# Patient Record
Sex: Male | Born: 1975 | Hispanic: Yes | Marital: Single | Smoking: Never smoker
Health system: Southern US, Community
[De-identification: ages and names within clinical notes are randomized; demographics above are authoritative.]

---

## 2017-05-25 ENCOUNTER — Emergency Department (HOSPITAL_COMMUNITY)
Admission: EM | Admit: 2017-05-25 | Discharge: 2017-05-25 | Disposition: A | Discharge status: Home / Self Care | Payer: Self-pay | Attending: Emergency Medicine | Admitting: Emergency Medicine

## 2017-05-25 ENCOUNTER — Emergency Department (HOSPITAL_COMMUNITY): Payer: Self-pay

## 2017-05-25 ENCOUNTER — Encounter (HOSPITAL_COMMUNITY): Payer: Self-pay | Admitting: Emergency Medicine

## 2017-05-25 DIAGNOSIS — J40 Bronchitis, not specified as acute or chronic: Secondary | ICD-10-CM | POA: Insufficient documentation

## 2017-05-25 DIAGNOSIS — R0789 Other chest pain: Secondary | ICD-10-CM | POA: Insufficient documentation

## 2017-05-25 DIAGNOSIS — R51 Headache: Secondary | ICD-10-CM | POA: Insufficient documentation

## 2017-05-25 LAB — CBC
HCT: 43.4 % (ref 39.0–52.0)
HEMOGLOBIN: 14.8 g/dL (ref 13.0–17.0)
MCH: 27.9 pg (ref 26.0–34.0)
MCHC: 34.1 g/dL (ref 30.0–36.0)
MCV: 81.7 fL (ref 78.0–100.0)
Platelets: 365 10*3/uL (ref 150–400)
RBC: 5.31 MIL/uL (ref 4.22–5.81)
RDW: 12 % (ref 11.5–15.5)
WBC: 8.6 10*3/uL (ref 4.0–10.5)

## 2017-05-25 LAB — BASIC METABOLIC PANEL
ANION GAP: 9 (ref 5–15)
BUN: 18 mg/dL (ref 6–20)
CHLORIDE: 103 mmol/L (ref 101–111)
CO2: 26 mmol/L (ref 22–32)
CREATININE: 1.06 mg/dL (ref 0.61–1.24)
Calcium: 9.8 mg/dL (ref 8.9–10.3)
GFR calc Af Amer: >60 mL/min (ref >60)
GFR calc non Af Amer: >60 mL/min (ref >60)
GLUCOSE: 122 mg/dL — AB (ref 65–99)
Potassium: 3.9 mmol/L (ref 3.5–5.1)
Sodium: 138 mmol/L (ref 135–145)

## 2017-05-25 LAB — I-STAT TROPONIN, ED: Troponin i, poc: 0 ng/mL (ref 0.00–0.08)

## 2017-05-25 MED ORDER — DOXYCYCLINE HYCLATE 100 MG PO CAPS: 100.0000 mg | ORAL_CAPSULE | Freq: Two times a day (BID) | ORAL | 0 refills | Status: AC

## 2017-05-25 MED ORDER — SODIUM CHLORIDE 0.9 % IV BOLUS (SEPSIS)
1000.0000 mL | Freq: Once | INTRAVENOUS | Status: AC
Start: 2017-05-25 — End: 2017-05-25
  Administered 2017-05-25: 1000 mL via INTRAVENOUS

## 2017-05-25 MED ORDER — IBUPROFEN 800 MG PO TABS: 800.0000 mg | ORAL_TABLET | Freq: Three times a day (TID) | ORAL | 0 refills | Status: AC | PRN

## 2017-05-25 MED ORDER — KETOROLAC TROMETHAMINE 30 MG/ML IJ SOLN
30.0000 mg | Freq: Once | INTRAMUSCULAR | Status: AC
  Administered 2017-05-25: 30 mg via INTRAVENOUS
  Filled 2017-05-25: qty 1

## 2017-05-25 NOTE — Discharge Instructions (Signed)
Follow-up with with your family doctor in 1 week if not improving

## 2017-05-25 NOTE — ED Provider Notes (Signed)
[facility]-EMERGENCY DEPT Provider Note   CSN: [id number] Arrival date & time: 05/25/17  1109     History   Chief Complaint Chief Complaint  Patient presents with  . Headache  . Chest Pain    HPI Philip Mathews is a 42 y.o. male.  Patient complains of cough congestion for over a week with myalgias   The history is provided by the patient.  Cough  This is a new problem. The current episode started more than 1 week ago. The problem occurs constantly. The problem has not changed since onset.The cough is non-productive. There has been no fever. Associated symptoms include chest pain. Pertinent negatives include no headaches.    History reviewed. No pertinent past medical history.  There are no active problems to display for this patient.   History reviewed. No pertinent surgical history.     Home Medications    Prior to Admission medications   Medication Sig Start Date End Date Taking? Authorizing Provider  Pseudoeph-Doxylamine-DM-APAP (NYQUIL PO) Take 30 mLs by mouth at bedtime as needed (cold symptoms).   Yes [provider]  doxycycline (VIBRAMYCIN) 100 MG capsule Take 1 capsule (100 mg total) by mouth 2 (two) times daily. One po bid x 7 days 05/25/17   Bethann BerkshireZammit, Brieanne Mignone, MD  ibuprofen (ADVIL,MOTRIN) 800 MG tablet Take 1 tablet (800 mg total) by mouth every 8 (eight) hours as needed. 05/25/17   Bethann BerkshireZammit, Dorlene Footman, MD    Family History History reviewed. No pertinent family history.  Social History Social History   Tobacco Use  . Smoking status: Never Smoker  . Smokeless tobacco: Never Used  Substance Use Topics  . Alcohol use: Not on file  . Drug use: Not on file     Allergies   Patient has no known allergies.   Review of Systems Review of Systems  Constitutional: Negative for appetite change and fatigue.  HENT: Negative for congestion, ear discharge and sinus pressure.   Eyes: Negative for discharge.  Respiratory: Positive for  cough.   Cardiovascular: Positive for chest pain.  Gastrointestinal: Negative for abdominal pain and diarrhea.  Genitourinary: Negative for frequency and hematuria.  Musculoskeletal: Negative for back pain.  Skin: Negative for rash.  Neurological: Negative for seizures and headaches.  Psychiatric/Behavioral: Negative for hallucinations.     Physical Exam Updated Vital Signs BP 115/74   Pulse 72   Temp 98.4 F (36.9 C) (Oral)   Resp (!) 22   SpO2 99%   Physical Exam  Constitutional: He is oriented to person, place, and time. He appears well-developed.  HENT:  Head: Normocephalic.  Eyes: Conjunctivae and EOM are normal. No scleral icterus.  Neck: Neck supple. No thyromegaly present.  Cardiovascular: Normal rate and regular rhythm. Exam reveals no gallop and no friction rub.  No murmur heard. Pulmonary/Chest: No stridor. He has no wheezes. He has no rales. He exhibits no tenderness.  Abdominal: He exhibits no distension. There is no tenderness. There is no rebound.  Musculoskeletal: Normal range of motion. He exhibits no edema.  Lymphadenopathy:    He has no cervical adenopathy.  Neurological: He is oriented to person, place, and time. He exhibits normal muscle tone. Coordination normal.  Skin: No rash noted. No erythema.  Psychiatric: He has a normal mood and affect. His behavior is normal.     ED Treatments / Results  Labs (all labs ordered are listed, but only abnormal results are displayed) Labs Reviewed  BASIC METABOLIC PANEL - Abnormal; Notable for  the following components:      Result Value   Glucose, Bld 122 (*)    All other components within normal limits  CBC  I-STAT TROPONIN, ED    EKG  EKG Interpretation  Date/Time:  Wednesday May 25 2017 11:31:33 EST Ventricular Rate:  115 PR Interval:    QRS Duration: 95 QT Interval:  308 QTC Calculation: 426 R Axis:   107 Text Interpretation:  Sinus tachycardia Right axis deviation Borderline T  abnormalities, inferior leads Confirmed by Bethann Berkshire [phone]) on 05/25/2017 6:05:14 PM       Radiology Dg Chest 2 View  Result Date: 05/25/2017 CLINICAL DATA:  Central chest pain, fever, shortness of breath and cough. EXAM: CHEST  2 VIEW COMPARISON:  None. FINDINGS: Trachea is midline. Heart size normal. Lungs are clear. No pleural fluid. IMPRESSION: Negative. Electronically Signed   By: Leanna Battles M.D.   On: 05/25/2017 12:19    Procedures Procedures (including critical care time)  Medications Ordered in ED Medications  sodium chloride 0.9 % bolus 1,000 mL (1,000 mLs Intravenous New Bag/Given 05/25/17 2004)  ketorolac (TORADOL) 30 MG/ML injection 30 mg (30 mg Intravenous Given 05/25/17 2005)     Initial Impression / Assessment and Plan / ED Course  I have reviewed the triage vital signs and the nursing notes.  Pertinent labs & imaging results that were available during my care of the patient were reviewed by me and considered in my medical decision making (see chart for details).    Patient with persistent bronchitis and myalgias.  Patient will be placed on doxycycline and given Motrin and will follow-up in a week  Final Clinical Impressions(s) / ED Diagnoses   Final diagnoses:  Bronchitis    ED Discharge Orders        Ordered    doxycycline (VIBRAMYCIN) 100 MG capsule  2 times daily     05/25/17 2126    ibuprofen (ADVIL,MOTRIN) 800 MG tablet  Every 8 hours PRN     05/25/17 2126       Bethann Berkshire, MD 05/25/17 2132

## 2017-05-25 NOTE — ED Notes (Signed)
Interpreter Vernona RiegerLaura [phone] for assessment and treatment.

## 2017-05-25 NOTE — ED Triage Notes (Addendum)
Pt reports HA, fever, SOB and CP for the past few days. Has had generalized malaise for the past 2 weeks. No dizziness.

## 2019-02-26 IMAGING — CR DG CHEST 2V
2 series · 2 of 2 positions shown · non-contrast
Comparison: None.

CLINICAL DATA: Central chest pain, fever, shortness of breath and
cough.

EXAM:
CHEST  2 VIEW

[w chest pa]
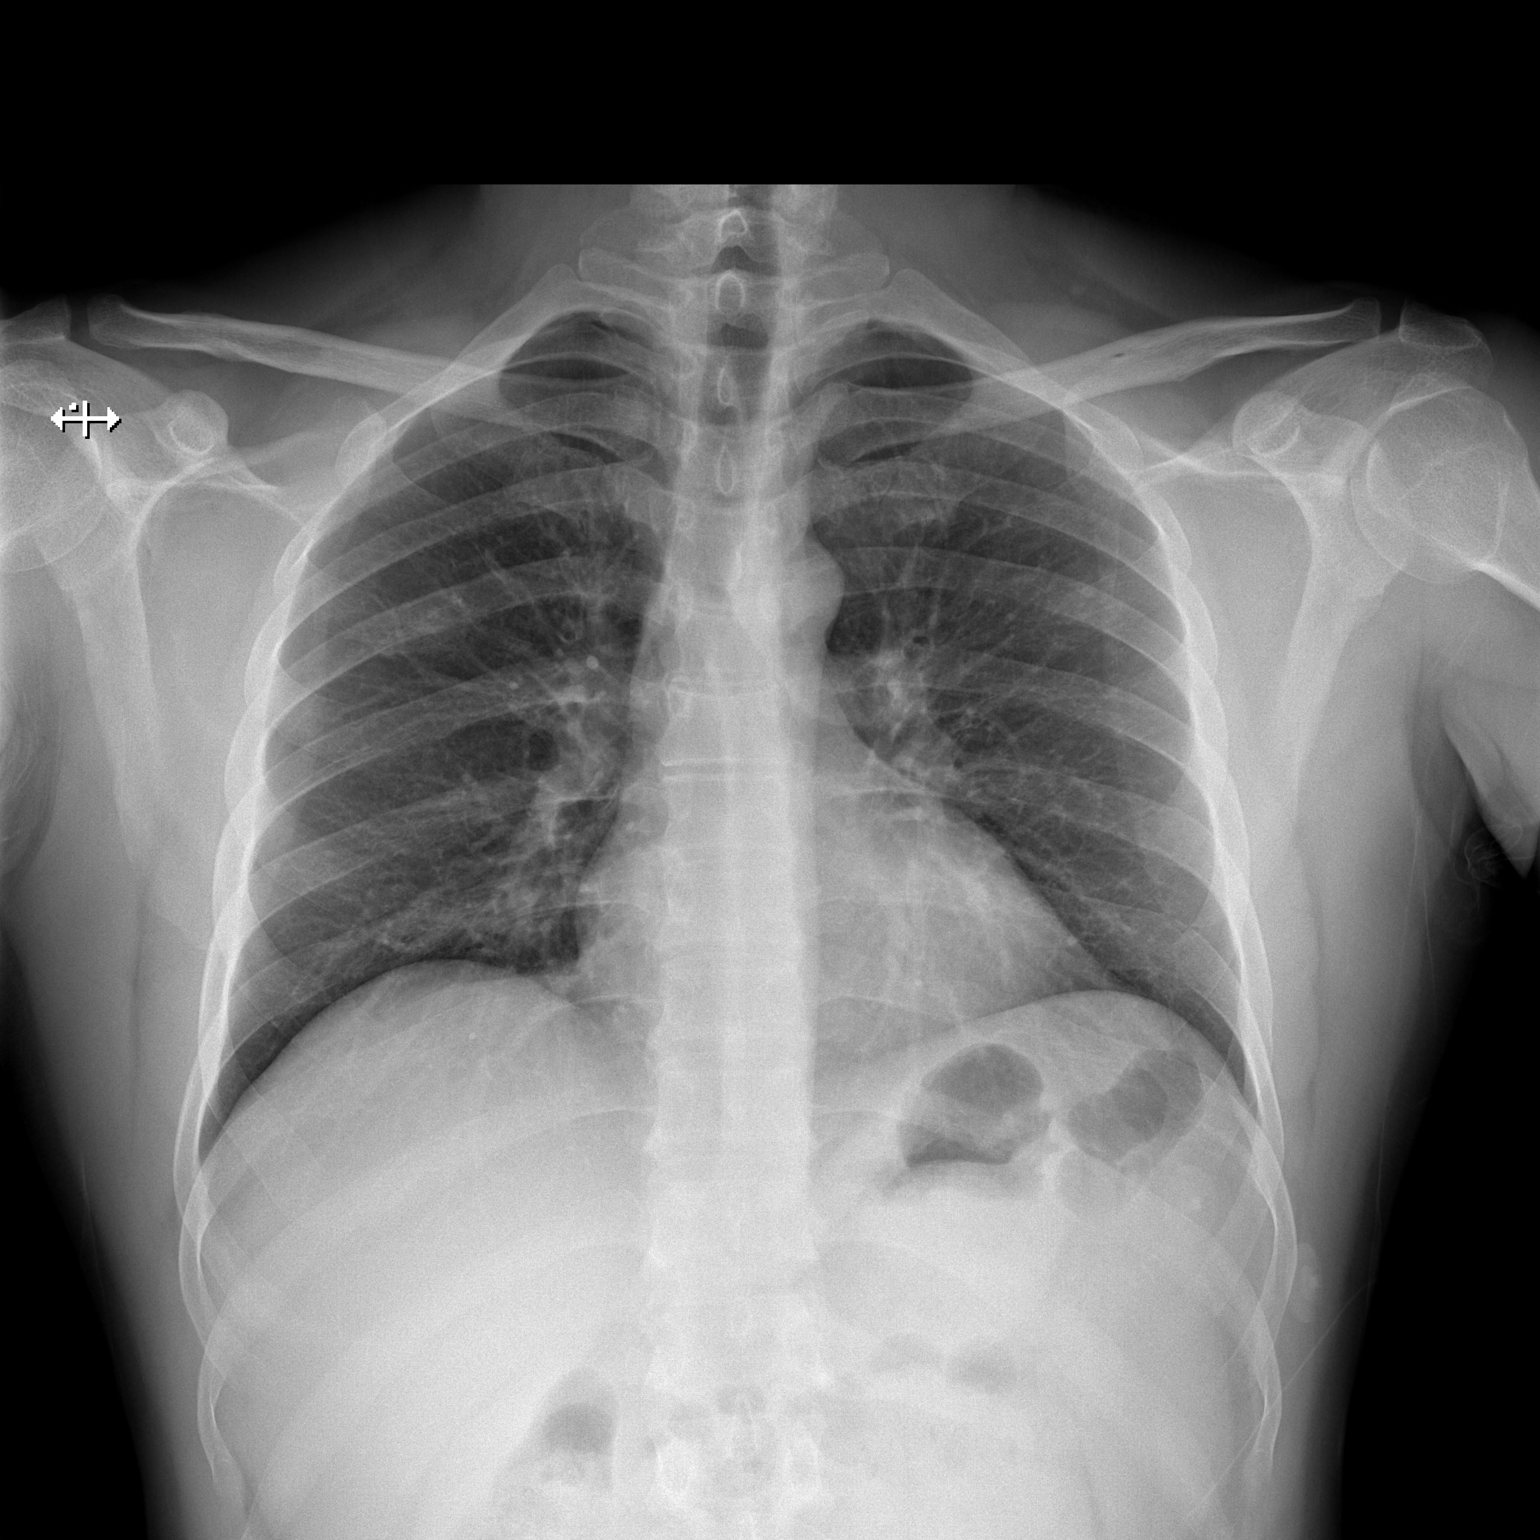

[w chest lat]
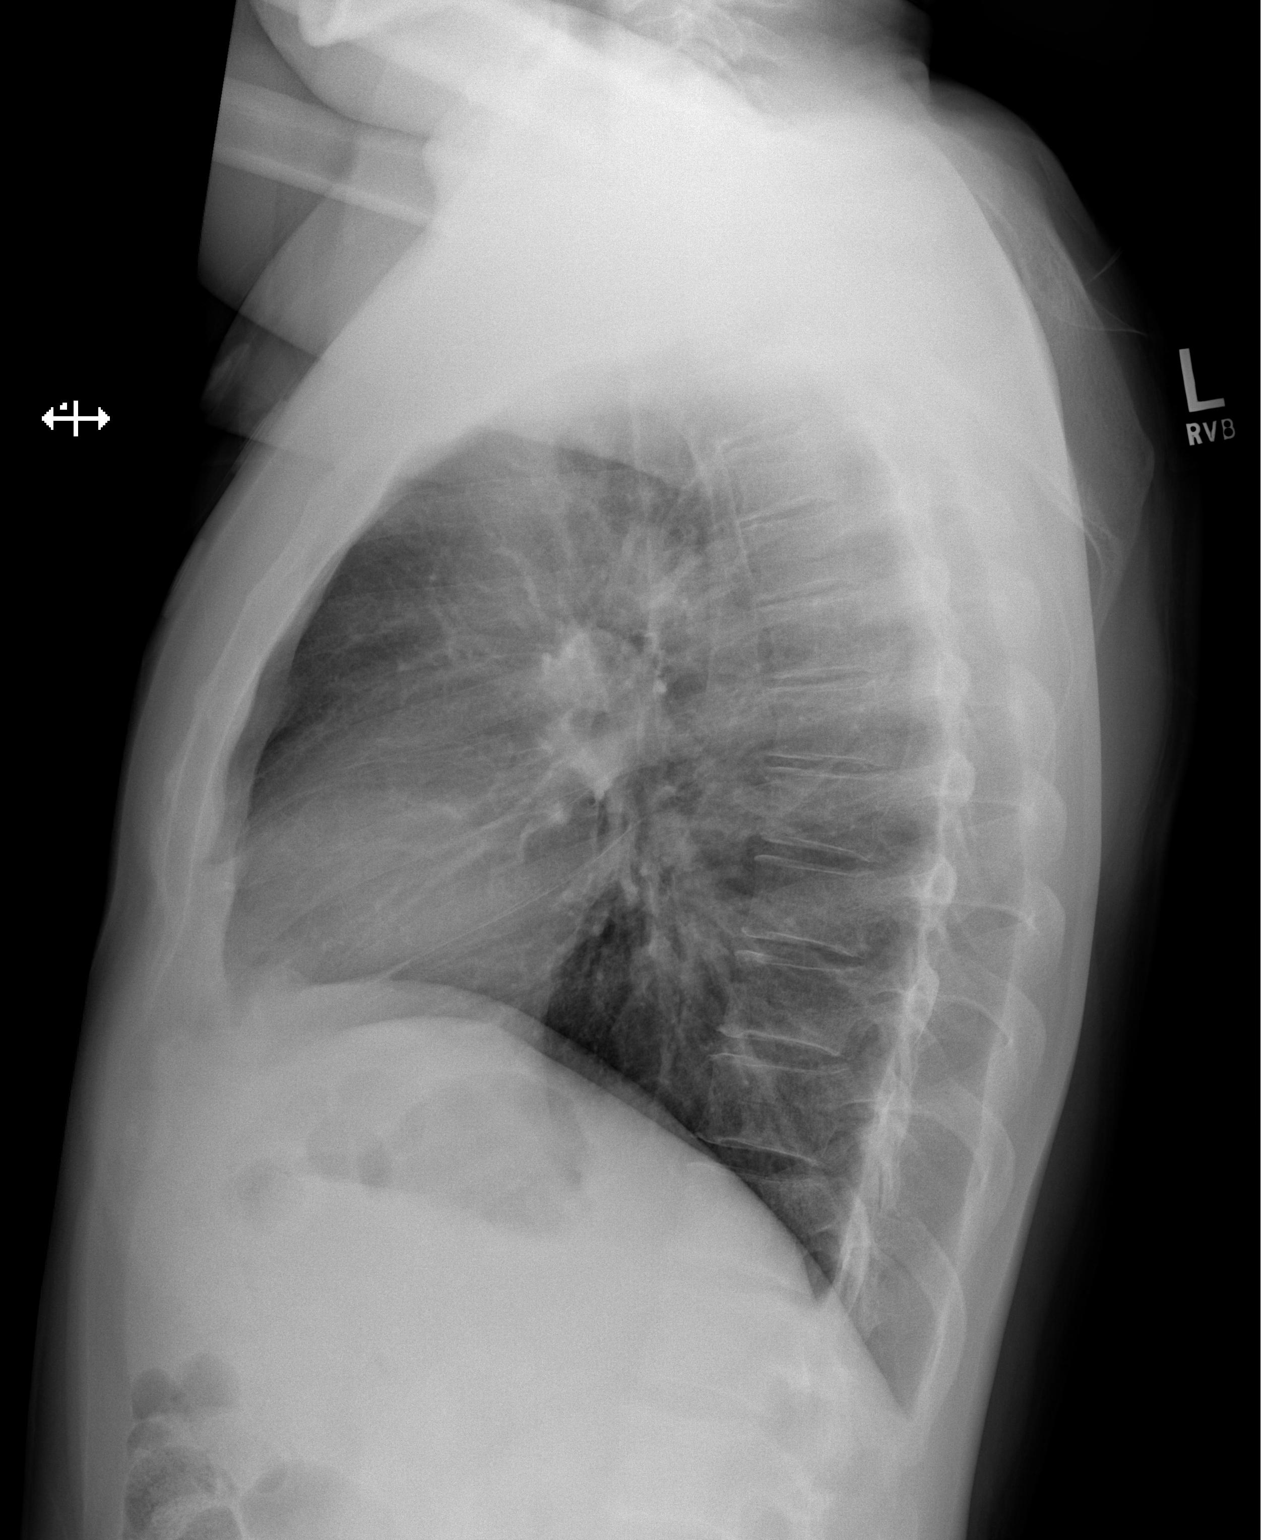

[2 of 2 positions shown; findings below may reference images not displayed]

FINDINGS: Trachea is midline. Heart size normal. Lungs are clear. No pleural
fluid.
IMPRESSION: Negative.
